# Patient Record
Sex: Female | Born: 1995 | Race: Black or African American | Hispanic: No | Marital: Single | Smoking: Never smoker
Health system: Southern US, Community
[De-identification: ages and names within clinical notes are randomized; demographics above are authoritative.]

## PROBLEM LIST (undated history)

## (undated) ENCOUNTER — Inpatient Hospital Stay (HOSPITAL_COMMUNITY): Payer: Self-pay

## (undated) DIAGNOSIS — Z789 Other specified health status: Secondary | ICD-10-CM

## (undated) HISTORY — PX: INDUCED ABORTION: SHX677

## (undated) HISTORY — PX: NO PAST SURGERIES: SHX2092

---

## 2018-09-03 ENCOUNTER — Encounter (HOSPITAL_COMMUNITY): Payer: Self-pay | Admitting: *Deleted

## 2018-09-03 ENCOUNTER — Inpatient Hospital Stay (HOSPITAL_COMMUNITY)
Admission: AD | Admit: 2018-09-03 | Discharge: 2018-09-03 | Disposition: A | Discharge status: Home / Self Care | Payer: 59 | Source: Ambulatory Visit | Attending: Obstetrics and Gynecology | Admitting: Obstetrics and Gynecology

## 2018-09-03 DIAGNOSIS — O219 Vomiting of pregnancy, unspecified: Secondary | ICD-10-CM | POA: Diagnosis not present

## 2018-09-03 DIAGNOSIS — R112 Nausea with vomiting, unspecified: Secondary | ICD-10-CM

## 2018-09-03 DIAGNOSIS — Z3A12 12 weeks gestation of pregnancy: Secondary | ICD-10-CM | POA: Insufficient documentation

## 2018-09-03 DIAGNOSIS — I951 Orthostatic hypotension: Secondary | ICD-10-CM

## 2018-09-03 DIAGNOSIS — R42 Dizziness and giddiness: Secondary | ICD-10-CM | POA: Diagnosis not present

## 2018-09-03 DIAGNOSIS — O26891 Other specified pregnancy related conditions, first trimester: Secondary | ICD-10-CM | POA: Diagnosis not present

## 2018-09-03 LAB — POCT PREGNANCY, URINE: Preg Test, Ur: POSITIVE — AB

## 2018-09-03 LAB — URINALYSIS, ROUTINE W REFLEX MICROSCOPIC
Bilirubin Urine: NEGATIVE
GLUCOSE, UA: NEGATIVE mg/dL
Hgb urine dipstick: NEGATIVE
KETONES UR: 20 mg/dL — AB
Nitrite: NEGATIVE
PH: 6 (ref 5.0–8.0)
PROTEIN: 30 mg/dL — AB
Specific Gravity, Urine: 1.03 (ref 1.005–1.030)

## 2018-09-03 MED ORDER — M.V.I. ADULT IV INJ
Freq: Once | INTRAVENOUS | Status: AC
  Administered 2018-09-03: 07:00:00 via INTRAVENOUS
  Filled 2018-09-03: qty 10

## 2018-09-03 MED ORDER — PROMETHAZINE HCL 25 MG PO TABS
25.0000 mg | ORAL_TABLET | Freq: Once | ORAL | Status: AC
  Administered 2018-09-03: 25 mg via ORAL
  Filled 2018-09-03: qty 1

## 2018-09-03 MED ORDER — LACTATED RINGERS IV SOLN
INTRAVENOUS | Status: DC
  Administered 2018-09-03: 06:00:00 via INTRAVENOUS

## 2018-09-03 MED ORDER — M.V.I. ADULT IV INJ: Freq: Once | INTRAVENOUS | Status: DC

## 2018-09-03 MED ORDER — PROMETHAZINE HCL 25 MG PO TABS: 12.5000 mg | ORAL_TABLET | Freq: Four times a day (QID) | ORAL | 0 refills | Status: DC | PRN

## 2018-09-03 NOTE — Discharge Instructions (Signed)
Morning Sickness °Morning sickness is when you feel sick to your stomach (nauseous) during pregnancy. This nauseous feeling may or may not come with vomiting. It often occurs in the morning but can be a problem any time of day. Morning sickness is most common during the first trimester, but it may continue throughout pregnancy. While morning sickness is unpleasant, it is usually harmless unless you develop severe and continual vomiting (hyperemesis gravidarum). This condition requires more intense treatment. °What are the causes? °The cause of morning sickness is not completely known but seems to be related to normal hormonal changes that occur in pregnancy. °What increases the risk? °You are at greater risk if you: °· Experienced nausea or vomiting before your pregnancy. °· Had morning sickness during a previous pregnancy. °· Are pregnant with more than one baby, such as twins. ° °How is this treated? °Do not use any medicines (prescription, over-the-counter, or herbal) for morning sickness without first talking to your health care provider. Your health care provider may prescribe or recommend: °· Vitamin B6 supplements. °· Anti-nausea medicines. °· The herbal medicine ginger. ° °Follow these instructions at home: °· Only take over-the-counter or prescription medicines as directed by your health care provider. °· Taking multivitamins before getting pregnant can prevent or decrease the severity of morning sickness in most women. °· Eat a piece of dry toast or unsalted crackers before getting out of bed in the morning. °· Eat five or six small meals a day. °· Eat dry and bland foods (rice, baked potato). Foods high in carbohydrates are often helpful. °· Do not drink liquids with your meals. Drink liquids between meals. °· Avoid greasy, fatty, and spicy foods. °· Get someone to cook for you if the smell of any food causes nausea and vomiting. °· If you feel nauseous after taking prenatal vitamins, take the vitamins at  night or with a snack. °· Snack on protein foods (nuts, yogurt, cheese) between meals if you are hungry. °· Eat unsweetened gelatins for desserts. °· Wearing an acupressure wristband (worn for sea sickness) may be helpful. °· Acupuncture may be helpful. °· Do not smoke. °· Get a humidifier to keep the air in your house free of odors. °· Get plenty of fresh air. °Contact a health care provider if: °· Your home remedies are not working, and you need medicine. °· You feel dizzy or lightheaded. °· You are losing weight. °Get help right away if: °· You have persistent and uncontrolled nausea and vomiting. °· You pass out (faint). °This information is not intended to replace advice given to you by your health care provider. Make sure you discuss any questions you have with your health care provider. °Document Released: 01/29/2007 Document Revised: 05/15/2016 Document Reviewed: 05/25/2013 °Elsevier Interactive Patient Education © 2017 [employer] ° °

## 2018-09-03 NOTE — MAU Note (Signed)
PT SAYS HAD PREG CONFIRMED AT OFFICE.  HAS BEEN VOMITING-  AT 0320.  . - VOMITED.   THEN FELT  WEAK AND DIZZY.  HAS AN APPOINTMENT THIS AM  WITH OFFICE.

## 2018-09-03 NOTE — MAU Provider Note (Signed)
Chief Complaint:  Emesis   First Provider Initiated Contact with Patient 09/03/18 0450     HPI: Haley Larsen is a 22 y.o. G2P0010 at 6312w1dwho presents to maternity admissions reporting vomiting once at home. Felt dizzy afterward so came in.  Vomited once here, small amount..Had morning sickness earlier in pregnancy that got better with no meds She reports good fetal movement, denies LOF, vaginal bleeding, vaginal itching/burning, urinary symptoms, h/a, dizziness, diarrhea, constipation or fever/chills.   Emesis   This is a recurrent problem. The current episode started today. The problem occurs less than 2 times per day. There has been no fever. Associated symptoms include dizziness. Pertinent negatives include no abdominal pain, chills, diarrhea or fever. She has tried nothing for the symptoms.     Past Medical History: No past medical history on file.  Past obstetric history: OB History  Gravida Para Term Preterm AB Living  2       1    SAB TAB Ectopic Multiple Live Births    1     0    # Outcome Date GA Lbr Len/2nd Weight Sex Delivery Anes PTL Lv  2 Current           1 TAB             Past Surgical History: History reviewed. No pertinent surgical history.  Family History: History reviewed. No pertinent family history.  Social History: Social History   Tobacco Use  . Smoking status: Never Smoker  . Smokeless tobacco: Never Used  Substance Use Topics  . Alcohol use: Not Currently    Comment: BEFORE PREG  . Drug use: Not Currently    Types: Marijuana    Comment: LAST SMOKED - AUG    Allergies: No Known Allergies  Meds:  No medications prior to admission.    I have reviewed patient's Past Medical Hx, Surgical Hx, Family Hx, Social Hx, medications and allergies.   ROS:  Review of Systems  Constitutional: Negative for chills and fever.  Gastrointestinal: Positive for vomiting. Negative for abdominal pain and diarrhea.  Neurological: Positive for dizziness.    Other systems negative  Physical Exam   Patient Vitals for the past 24 hrs:  BP Temp Temp src Pulse Resp Height Weight  09/03/18 0421 125/70 98.8 F (37.1 C) Oral 85 20 5\' 2"  (1.575 m) 61.9 kg   Vitals:   09/03/18 0421 09/03/18 0459 09/03/18 0500 09/03/18 0502  BP: 125/70 115/69 115/74 110/78  Pulse: 85 77 (!) 102 (!) 113  Resp: 20     Temp: 98.8 F (37.1 C)     TempSrc: Oral     Weight: 61.9 kg     Height: 5\' 2"  (1.575 m)       Constitutional: Well-developed, well-nourished female in no acute distress.  Cardiovascular: normal rate and rhythm Respiratory: normal effort, clear to auscultation bilaterally GI: Abd soft, non-tender, gravid appropriate for gestational age.   No rebound or guarding. MS: Extremities nontender, no edema, normal ROM Neurologic: Alert and oriented x 4.  GU: Neg CVAT.  PELVIC EXAM: deferred  FHT normal in office today via US   Labs: Results for orders placed or performed during the hospital encounter of 09/03/18 (from the past 24 hour(s))  Urinalysis, Routine w reflex microscopic     Status: Abnormal   Collection Time: 09/03/18  4:28 AM  Result Value Ref Range   Color, Urine YELLOW YELLOW   APPearance HAZY (A) CLEAR   Specific Gravity, Urine  1.030 1.005 - 1.030   pH 6.0 5.0 - 8.0   Glucose, UA NEGATIVE NEGATIVE mg/dL   Hgb urine dipstick NEGATIVE NEGATIVE   Bilirubin Urine NEGATIVE NEGATIVE   Ketones, ur 20 (A) NEGATIVE mg/dL   Protein, ur 30 (A) NEGATIVE mg/dL   Nitrite NEGATIVE NEGATIVE   Leukocytes, UA SMALL (A) NEGATIVE   RBC / HPF 0-5 0 - 5 RBC/hpf   WBC, UA 0-5 0 - 5 WBC/hpf   Bacteria, UA RARE (A) NONE SEEN   Squamous Epithelial / LPF 0-5 0 - 5   Mucus PRESENT   Pregnancy, urine POC     Status: Abnormal   Collection Time: 09/03/18  4:30 AM  Result Value Ref Range   Preg Test, Ur POSITIVE (A) NEGATIVE      Imaging:  No results found.  MAU Course/MDM: I have ordered labs and reviewed results.  UA does not show  significant dehydration.   Will check orthostatic vital signs  Consult Dr Hinton Rao with presentation, exam findings and test results.  Treatments in MAU included Phenergan, and two liters of IV fluids.   Has not vomited since second liter  Assessment: Single intrauterine pregnancy at [redacted]w[redacted]d Vomiting, new recurrence Mild orthostasis  Plan: Discharge home Advance diet as tolerated Will Rx Phenergan Follow up in Office for prenatal visits and recheck  Encouraged to return here or to other Urgent Care/ED if she develops worsening of symptoms, increase in pain, fever, or other concerning symptoms.   Pt stable at time of discharge.  Wynelle Bourgeois CNM, MSN Certified Nurse-Midwife 09/03/2018 4:54 AM

## 2018-09-06 LAB — OB RESULTS CONSOLE ANTIBODY SCREEN: ANTIBODY SCREEN: NEGATIVE

## 2018-09-06 LAB — OB RESULTS CONSOLE GC/CHLAMYDIA
CHLAMYDIA, DNA PROBE: NEGATIVE
GC PROBE AMP, GENITAL: NEGATIVE

## 2018-09-06 LAB — OB RESULTS CONSOLE ABO/RH: RH Type: POSITIVE

## 2018-09-06 LAB — OB RESULTS CONSOLE RPR: RPR: NONREACTIVE

## 2018-09-06 LAB — OB RESULTS CONSOLE HIV ANTIBODY (ROUTINE TESTING): HIV: NONREACTIVE

## 2018-09-06 LAB — OB RESULTS CONSOLE HEPATITIS B SURFACE ANTIGEN: HEP B S AG: NEGATIVE

## 2018-09-06 LAB — OB RESULTS CONSOLE RUBELLA ANTIBODY, IGM: Rubella: NON-IMMUNE/NOT IMMUNE

## 2018-12-22 NOTE — L&D Delivery Note (Signed)
Delivery Note Pt pushed very well for 30-73min.  At 12:19 AM a viable and healthy female was delivered via Vaginal, Spontaneous (Presentation: OA; LOT - compound presentation with hand ).  APGAR: 9,9 ; weight P .   Placenta status: delivered, intact.  Cord: 3V  with the following complications: none.   Anesthesia:  epidural Episiotomy: None Lacerations: 1st degree;Vaginal; R labial - hemostatic Suture Repair: 3.0 vicryl rapide Est. Blood Loss (mL):  193cc  Mom to postpartum.  Baby to Couplet care / Skin to Skin.  Haley Larsen 03/18/2019, 12:36 AM  A+/RNI/Br/Contra ?/no Tdap in [facility]

## 2019-01-06 ENCOUNTER — Encounter (HOSPITAL_COMMUNITY): Payer: Self-pay

## 2019-01-06 ENCOUNTER — Inpatient Hospital Stay (HOSPITAL_BASED_OUTPATIENT_CLINIC_OR_DEPARTMENT_OTHER): Payer: 59

## 2019-01-06 ENCOUNTER — Inpatient Hospital Stay (HOSPITAL_COMMUNITY)
Admission: AD | Admit: 2019-01-06 | Discharge: 2019-01-07 | Disposition: A | Discharge status: Home / Self Care | Payer: 59 | Attending: Obstetrics and Gynecology | Admitting: Obstetrics and Gynecology

## 2019-01-06 DIAGNOSIS — O4693 Antepartum hemorrhage, unspecified, third trimester: Secondary | ICD-10-CM

## 2019-01-06 DIAGNOSIS — Z3A30 30 weeks gestation of pregnancy: Secondary | ICD-10-CM | POA: Insufficient documentation

## 2019-01-06 DIAGNOSIS — O368130 Decreased fetal movements, third trimester, not applicable or unspecified: Secondary | ICD-10-CM | POA: Insufficient documentation

## 2019-01-06 DIAGNOSIS — B373 Candidiasis of vulva and vagina: Secondary | ICD-10-CM | POA: Insufficient documentation

## 2019-01-06 DIAGNOSIS — O98813 Other maternal infectious and parasitic diseases complicating pregnancy, third trimester: Secondary | ICD-10-CM | POA: Insufficient documentation

## 2019-01-06 DIAGNOSIS — N859 Noninflammatory disorder of uterus, unspecified: Secondary | ICD-10-CM

## 2019-01-06 DIAGNOSIS — N858 Other specified noninflammatory disorders of uterus: Secondary | ICD-10-CM

## 2019-01-06 LAB — URINALYSIS, ROUTINE W REFLEX MICROSCOPIC
BILIRUBIN URINE: NEGATIVE
GLUCOSE, UA: NEGATIVE mg/dL
HGB URINE DIPSTICK: NEGATIVE
KETONES UR: NEGATIVE mg/dL
Nitrite: NEGATIVE
Protein, ur: NEGATIVE mg/dL
Specific Gravity, Urine: 1.016 (ref 1.005–1.030)
pH: 6 (ref 5.0–8.0)

## 2019-01-06 NOTE — MAU Note (Addendum)
Pt here for light bleeding after using the bathroom earlier today. States she used the bathroom prior to coming in and it was less than earlier. Pt reports decreased fetal movement. Reports last feeling baby move around 6pm. Pt reports vaginal discomfort but denies contractions. FHR 150's in triage with audible fetal movement noted.

## 2019-01-06 NOTE — MAU Provider Note (Signed)
Chief Complaint:  Decreased Fetal Movement and Vaginal Bleeding   First Provider Initiated Contact with Patient 01/06/19 2203     HPI: Lance CoonJa'Lynn Cammarata is a 23 y.o. G2P0010 at 7130w0dwho presents to maternity admissions reporting pink/brown spotting today with decreased fetal movement this evening.  20 week US showed posterior placenta.. No recent intercourse.  She reports good fetal movement now, denies LOF, vaginal itching/burning, urinary symptoms, h/a, dizziness, n/v, diarrhea, constipation or fever/chills.    Vaginal Bleeding  The patient's primary symptoms include genital itching, vaginal bleeding (once today) and vaginal discharge. The patient's pertinent negatives include no genital lesions, genital odor or pelvic pain. This is a new problem. The current episode started today. The problem occurs rarely. The problem has been resolved. The patient is experiencing no pain. She is pregnant. Pertinent negatives include no abdominal pain, back pain, chills, constipation, diarrhea, dysuria, fever, frequency, nausea or vomiting. The vaginal discharge was white, thick and copious. There has been no bleeding. She has not been passing clots. She has not been passing tissue. Nothing aggravates the symptoms. She has tried nothing for the symptoms.   RN note: Pt here for light bleeding after using the bathroom earlier today. States she used the bathroom prior to coming in and it was less than earlier. Pt reports decreased fetal movement. Reports last feeling baby move around 6pm. Pt reports vaginal discomfort but denies contractions. FHR 150's in triage with audible fetal movement noted.  Past Medical History: History reviewed. No pertinent past medical history.  Past obstetric history: OB History  Gravida Para Term Preterm AB Living  2       1    SAB TAB Ectopic Multiple Live Births    1     0    # Outcome Date GA Lbr Len/2nd Weight Sex Delivery Anes PTL Lv  2 Current           1 TAB              Past Surgical History: History reviewed. No pertinent surgical history.  Family History: No family history on file.  Social History: Social History   Tobacco Use  . Smoking status: Never Smoker  . Smokeless tobacco: Never Used  Substance Use Topics  . Alcohol use: Not Currently    Comment: BEFORE PREG  . Drug use: Not Currently    Types: Marijuana    Comment: LAST SMOKED - AUG    Allergies: No Known Allergies  Meds:  Medications Prior to Admission  Medication Sig Dispense Refill Last Dose  . promethazine (PHENERGAN) 25 MG tablet Take 0.5-1 tablets (12.5-25 mg total) by mouth every 6 (six) hours as needed. 30 tablet 0     I have reviewed patient's Past Medical Hx, Surgical Hx, Family Hx, Social Hx, medications and allergies.   ROS:  Review of Systems  Constitutional: Negative for chills and fever.  Gastrointestinal: Negative for abdominal pain, constipation, diarrhea, nausea and vomiting.  Genitourinary: Positive for vaginal bleeding and vaginal discharge. Negative for dysuria, frequency and pelvic pain.  Musculoskeletal: Negative for back pain.   Other systems negative  Physical Exam   Patient Vitals for the past 24 hrs:  BP Temp Temp src Pulse Resp SpO2 Height Weight  01/06/19 2113 122/72 98 F (36.7 C) Oral (!) 106 16 97 % 5\' 3"  (1.6 m) 78 kg   Constitutional: Well-developed, well-nourished female in no acute distress.  Cardiovascular: normal rate and rhythm Respiratory: normal effort, clear to auscultation bilaterally GI: Abd  soft, non-tender, gravid appropriate for gestational age.   No rebound or guarding. MS: Extremities nontender, no edema, normal ROM Neurologic: Alert and oriented x 4.  GU: Neg CVAT.  PELVIC EXAM: Cervix pink, visually closed, without lesion, copious cottage-cheese looking discharge, vaginal walls and external genitalia normal     No colored/pink/brown discharge seen at all.  Cervix without lesion.  Dilation: Closed Effacement  (%): 20 Exam by:: Artelia LarocheM Brogen Duell CNM   FHT:  Baseline 140 , moderate variability, accelerations present, no decelerations except one tiny variable lasting 10 seconds.   Contractions: q 3-4 mins Irregular, not felt by patient    Labs: Results for orders placed or performed during the hospital encounter of 01/06/19 (from the past 24 hour(s))  Urinalysis, Routine w reflex microscopic     Status: Abnormal   Collection Time: 01/06/19  9:35 PM  Result Value Ref Range   Color, Urine YELLOW YELLOW   APPearance CLEAR CLEAR   Specific Gravity, Urine 1.016 1.005 - 1.030   pH 6.0 5.0 - 8.0   Glucose, UA NEGATIVE NEGATIVE mg/dL   Hgb urine dipstick NEGATIVE NEGATIVE   Bilirubin Urine NEGATIVE NEGATIVE   Ketones, ur NEGATIVE NEGATIVE mg/dL   Protein, ur NEGATIVE NEGATIVE mg/dL   Nitrite NEGATIVE NEGATIVE   Leukocytes, UA LARGE (A) NEGATIVE   RBC / HPF 0-5 0 - 5 RBC/hpf   WBC, UA 6-10 0 - 5 WBC/hpf   Bacteria, UA RARE (A) NONE SEEN   Squamous Epithelial / LPF 0-5 0 - 5   Mucus PRESENT       Imaging:  US showed posterior placenta No evidence of previa or abruption AFI normal   MAU Course/MDM: I have ordered labs and reviewed results.  NST reviewed, reactive.  Uterine contractions diminished over time.  Consult Dr Erin FullingHarraway-Smith with presentation, exam findings and test results.  Treatments in MAU included EFM, PO hydration.    Reviewed findings Light pink bleeding was likely related to erethema from vaginal yeast. Contractions were painless and did not change cervix Reviewed signs of preterm labor EFM has been reactive throughout  Assessment: Single intrauterine pregnancy at 7679w1d Decreased fetal movement Reported pink spotting, none seen on exam Vaginal yeast infection Uterine irritabililty   Plan: Discharge home Labor precautions and fetal kick counts Follow up in Office for prenatal visits and recheck of status Has appt on Tuesday  Encouraged to return here or to other  Urgent Care/ED if she develops worsening of symptoms, increase in pain, fever, or other concerning symptoms.  Pt stable at time of discharge.  Wynelle BourgeoisMarie Glen Blatchley CNM, MSN Certified Nurse-Midwife 01/06/2019 10:03 PM

## 2019-01-07 ENCOUNTER — Other Ambulatory Visit: Payer: Self-pay | Admitting: Advanced Practice Midwife

## 2019-01-07 DIAGNOSIS — O368130 Decreased fetal movements, third trimester, not applicable or unspecified: Secondary | ICD-10-CM

## 2019-01-07 MED ORDER — TERCONAZOLE 0.4 % VA CREA: 1.0000 | TOPICAL_CREAM | Freq: Every day | VAGINAL | 0 refills | Status: DC

## 2019-01-07 NOTE — Discharge Instructions (Signed)
Vaginal Bleeding During Pregnancy, Third Trimester  A small amount of bleeding (spotting) from the vagina is common during pregnancy. Sometimes the bleeding is normal and is not a problem, and sometimes it is a sign of something serious. Tell your doctor about any bleeding from your vagina right away. Follow these instructions at home: Activity  Follow your doctor's instructions about how active you can be. Your doctor may recommend that you: ? Stay in bed and only get up to use the bathroom. ? Continue light activity.  If needed, make plans for someone to help you with your normal activities.  Ask your doctor if it is safe for you to drive.  Do not lift anything that is heavier than 10 lb (4.5 kg) until your doctor says that this is safe.  Do not have sex or orgasms until your doctor says that this is safe. Medicines  Take over-the-counter and prescription medicines only as told by your doctor.  Do not take aspirin. It can cause bleeding. General instructions  Watch your condition for any changes.  Write down: ? The number of pads you use each day. ? How often you change pads. ? How soaked (saturated) your pads are.  Do not use tampons.  Do not douche.  If you pass any tissue from your vagina, save the tissue to show your doctor.  Keep all follow-up visits as told by your doctor. This is important. Contact a doctor if:  You have vaginal bleeding at any time during pregnancy.  You have cramps.  You have a fever. Get help right away if:  You have very bad cramps.  You have very bad pain in your back or belly (abdomen).  You have a gush of fluid from your vagina.  You pass large clots or a lot of tissue from your vagina.  Your bleeding gets worse.  You feel light-headed or weak.  You pass out (faint).  Your baby is moving less than usual, or not moving at all. Summary  Tell your doctor about any bleeding from your vagina right away.  Follow instructions  from your doctor about how active you can be. You may need someone to help you with your normal activities. This information is not intended to replace advice given to you by your health care provider. Make sure you discuss any questions you have with your health care provider. Document Released: 04/24/2014 Document Revised: 03/11/2017 Document Reviewed: 03/11/2017 Elsevier Interactive Patient Education  2019 [employer] Inc[employer]   Fetal Movement Counts Patient Name: ________________________________________________ Patient Due Date: ____________________ What is a fetal movement count?  A fetal movement count is the number of times that you feel your baby move during a certain amount of time. This may also be called a fetal kick count. A fetal movement count is recommended for every pregnant woman. You may be asked to start counting fetal movements as early as week 28 of your pregnancy. Pay attention to when your baby is most active. You may notice your baby's sleep and wake cycles. You may also notice things that make your baby move more. You should do a fetal movement count:  When your baby is normally most active.  At the same time each day. A good time to count movements is while you are resting, after having something to eat and drink. How do I count fetal movements? 1. Find a quiet, comfortable area. Sit, or lie down on your side. 2. Write down the date, the start time and stop  time, and the number of movements that you felt between those two times. Take this information with you to your health care visits. 3. For 2 hours, count kicks, flutters, swishes, rolls, and jabs. You should feel at least 10 movements during 2 hours. 4. You may stop counting after you have felt 10 movements. 5. If you do not feel 10 movements in 2 hours, have something to eat and drink. Then, keep resting and counting for 1 hour. If you feel at least 4 movements during that hour, you may stop counting. Contact a health  care provider if:  You feel fewer than 4 movements in 2 hours.  Your baby is not moving like he or she usually does. Date: ____________ Start time: ____________ Stop time: ____________ Movements: ____________ Date: ____________ Start time: ____________ Stop time: ____________ Movements: ____________ Date: ____________ Start time: ____________ Stop time: ____________ Movements: ____________ Date: ____________ Start time: ____________ Stop time: ____________ Movements: ____________ Date: ____________ Start time: ____________ Stop time: ____________ Movements: ____________ Date: ____________ Start time: ____________ Stop time: ____________ Movements: ____________ Date: ____________ Start time: ____________ Stop time: ____________ Movements: ____________ Date: ____________ Start time: ____________ Stop time: ____________ Movements: ____________ Date: ____________ Start time: ____________ Stop time: ____________ Movements: ____________ This information is not intended to replace advice given to you by your health care provider. Make sure you discuss any questions you have with your health care provider. Document Released: 01/07/2007 Document Revised: 08/06/2016 Document Reviewed: 01/17/2016 Elsevier Interactive Patient Education  2019 [employer].  Third Trimester of Pregnancy  The third trimester is from week 28 through week 40 (months 7 through 9). This trimester is when your unborn baby (fetus) is growing very fast. At the end of the ninth month, the unborn baby is about 20 inches in length. It weighs about 6-10 pounds. Follow these instructions at home: Medicines  Take over-the-counter and prescription medicines only as told by your doctor. Some medicines are safe and some medicines are not safe during pregnancy.  Take a prenatal vitamin that contains at least 600 micrograms (mcg) of folic acid.  If you have trouble pooping (constipation), take medicine that will make your stool soft  (stool softener) if your doctor approves. Eating and drinking   Eat regular, healthy meals.  Avoid raw meat and uncooked cheese.  If you get low calcium from the food you eat, talk to your doctor about taking a daily calcium supplement.  Eat four or five small meals rather than three large meals a day.  Avoid foods that are high in fat and sugars, such as fried and sweet foods.  To prevent constipation: ? Eat foods that are high in fiber, like fresh fruits and vegetables, whole grains, and beans. ? Drink enough fluids to keep your pee (urine) clear or pale yellow. Activity  Exercise only as told by your doctor. Stop exercising if you start to have cramps.  Avoid heavy lifting, wear low heels, and sit up straight.  Do not exercise if it is too hot, too humid, or if you are in a place of great height (high altitude).  You may continue to have sex unless your doctor tells you not to. Relieving pain and discomfort  Wear a good support bra if your breasts are tender.  Take frequent breaks and rest with your legs raised if you have leg cramps or low back pain.  Take warm water baths (sitz baths) to soothe pain or discomfort caused by hemorrhoids. Use hemorrhoid cream if your doctor  approves.  If you develop puffy, bulging veins (varicose veins) in your legs: ? Wear support hose or compression stockings as told by your doctor. ? Raise (elevate) your feet for 15 minutes, 3-4 times a day. ? Limit salt in your food. Safety  Wear your seat belt when driving.  Make a list of emergency phone numbers, including numbers for family, friends, the hospital, and police and fire departments. Preparing for your baby's arrival To prepare for the arrival of your baby:  Take prenatal classes.  Practice driving to the hospital.  Visit the hospital and tour the maternity area.  Talk to your work about taking leave once the baby comes.  Pack your hospital bag.  Prepare the baby's  room.  Go to your doctor visits.  Buy a rear-facing car seat. Learn how to install it in your car. General instructions  Do not use hot tubs, steam rooms, or saunas.  Do not use any products that contain nicotine or tobacco, such as cigarettes and e-cigarettes. If you need help quitting, ask your doctor.  Do not drink alcohol.  Do not douche or use tampons or scented sanitary pads.  Do not cross your legs for long periods of time.  Do not travel for long distances unless you must. Only do so if your doctor says it is okay.  Visit your dentist if you have not gone during your pregnancy. Use a soft toothbrush to brush your teeth. Be gentle when you floss.  Avoid cat litter boxes and soil used by cats. These carry germs that can cause birth defects in the baby and can cause a loss of your baby (miscarriage) or stillbirth.  Keep all your prenatal visits as told by your doctor. This is important. Contact a doctor if:  You are not sure if you are in labor or if your water has broken.  You are dizzy.  You have mild cramps or pressure in your lower belly.  You have a nagging pain in your belly area.  You continue to feel sick to your stomach, you throw up, or you have watery poop.  You have bad smelling fluid coming from your vagina.  You have pain when you pee. Get help right away if:  You have a fever.  You are leaking fluid from your vagina.  You are spotting or bleeding from your vagina.  You have severe belly cramps or pain.  You lose or gain weight quickly.  You have trouble catching your breath and have chest pain.  You notice sudden or extreme puffiness (swelling) of your face, hands, ankles, feet, or legs.  You have not felt the baby move in over an hour.  You have severe headaches that do not go away with medicine.  You have trouble seeing.  You are leaking, or you are having a gush of fluid, from your vagina before you are 37 weeks.  You have regular  belly spasms (contractions) before you are 37 weeks. Summary  The third trimester is from week 28 through week 40 (months 7 through 9). This time is when your unborn baby is growing very fast.  Follow your doctor's advice about medicine, food, and activity.  Get ready for the arrival of your baby by taking prenatal classes, getting all the baby items ready, preparing the baby's room, and visiting your doctor to be checked.  Get help right away if you are bleeding from your vagina, or you have chest pain and trouble catching your breath, or  if you have not felt your baby move in over an hour. This information is not intended to replace advice given to you by your health care provider. Make sure you discuss any questions you have with your health care provider. Document Released: 03/04/2010 Document Revised: 01/13/2017 Document Reviewed: 01/13/2017 Elsevier Interactive Patient Education  2019 [employer]

## 2019-01-07 NOTE — Progress Notes (Unsigned)
Vaginal yeast Rx Terazol 7

## 2019-01-07 NOTE — MAU Note (Signed)
Reported to bedside to go over discharge paperwork with patient and she was not in room.  Reported to CNM.  CNM states she spoke with patient prior to leaving about discharge teachings and patient was aware of reasons to return.

## 2019-02-23 LAB — OB RESULTS CONSOLE GBS: GBS: NEGATIVE

## 2019-03-04 ENCOUNTER — Encounter (HOSPITAL_COMMUNITY): Payer: Self-pay | Admitting: *Deleted

## 2019-03-04 ENCOUNTER — Inpatient Hospital Stay (HOSPITAL_COMMUNITY)
Admission: AD | Admit: 2019-03-04 | Discharge: 2019-03-04 | Disposition: A | Discharge status: Home / Self Care | Payer: 59 | Attending: Obstetrics and Gynecology | Admitting: Obstetrics and Gynecology

## 2019-03-04 ENCOUNTER — Other Ambulatory Visit: Payer: Self-pay

## 2019-03-04 DIAGNOSIS — Z3A38 38 weeks gestation of pregnancy: Secondary | ICD-10-CM | POA: Diagnosis not present

## 2019-03-04 DIAGNOSIS — O479 False labor, unspecified: Secondary | ICD-10-CM

## 2019-03-04 DIAGNOSIS — O471 False labor at or after 37 completed weeks of gestation: Secondary | ICD-10-CM | POA: Insufficient documentation

## 2019-03-04 DIAGNOSIS — Z3A30 30 weeks gestation of pregnancy: Secondary | ICD-10-CM

## 2019-03-04 HISTORY — DX: Other specified health status: Z78.9

## 2019-03-04 NOTE — Progress Notes (Signed)
NST Fetal Monitoring: Baseline: 135 Variability: mod Accelerations: + Decelerations: no Contractions: irregular    Donette Larry, CNM  03/04/2019 11:59 AM

## 2019-03-04 NOTE — MAU Note (Signed)
Presents with c/o ctxs every 6 minutes apart, reports ctxs started @ 0940 this am.  Denies VB or LOF.  Reports +FM.

## 2019-03-04 NOTE — Discharge Instructions (Signed)
Braxton Hicks Contractions Contractions of the uterus can occur throughout pregnancy, but they are not always a sign that you are in labor. You may have practice contractions called Braxton Hicks contractions. These false labor contractions are sometimes confused with true labor. What are Braxton Hicks contractions? Braxton Hicks contractions are tightening movements that occur in the muscles of the uterus before labor. Unlike true labor contractions, these contractions do not result in opening (dilation) and thinning of the cervix. Toward the end of pregnancy (32-34 weeks), Braxton Hicks contractions can happen more often and may become stronger. These contractions are sometimes difficult to tell apart from true labor because they can be very uncomfortable. You should not feel embarrassed if you go to the hospital with false labor. Sometimes, the only way to tell if you are in true labor is for your health care provider to look for changes in the cervix. The health care provider will do a physical exam and may monitor your contractions. If you are not in true labor, the exam should show that your cervix is not dilating and your water has not broken. If there are no other health problems associated with your pregnancy, it is completely safe for you to be sent home with false labor. You may continue to have Braxton Hicks contractions until you go into true labor. How to tell the difference between true labor and false labor True labor  Contractions last 30-70 seconds.  Contractions become very regular.  Discomfort is usually felt in the top of the uterus, and it spreads to the lower abdomen and low back.  Contractions do not go away with walking.  Contractions usually become more intense and increase in frequency.  The cervix dilates and gets thinner. False labor  Contractions are usually shorter and not as strong as true labor contractions.  Contractions are usually irregular.  Contractions  are often felt in the front of the lower abdomen and in the groin.  Contractions may go away when you walk around or change positions while lying down.  Contractions get weaker and are shorter-lasting as time goes on.  The cervix usually does not dilate or become thin. Follow these instructions at home:   Take over-the-counter and prescription medicines only as told by your health care provider.  Keep up with your usual exercises and follow other instructions from your health care provider.  Eat and drink lightly if you think you are going into labor.  If Braxton Hicks contractions are making you uncomfortable: ? Change your position from lying down or resting to walking, or change from walking to resting. ? Sit and rest in a tub of warm water. ? Drink enough fluid to keep your urine pale yellow. Dehydration may cause these contractions. ? Do slow and deep breathing several times an hour.  Keep all follow-up prenatal visits as told by your health care provider. This is important. Contact a health care provider if:  You have a fever.  You have continuous pain in your abdomen. Get help right away if:  Your contractions become stronger, more regular, and closer together.  You have fluid leaking or gushing from your vagina.  You pass blood-tinged mucus (bloody show).  You have bleeding from your vagina.  You have low back pain that you never had before.  You feel your baby's head pushing down and causing pelvic pressure.  Your baby is not moving inside you as much as it used to. Summary  Contractions that occur before labor are   called Braxton Hicks contractions, false labor, or practice contractions.  Braxton Hicks contractions are usually shorter, weaker, farther apart, and less regular than true labor contractions. True labor contractions usually become progressively stronger and regular, and they become more frequent.  Manage discomfort from Braxton Hicks contractions  by changing position, resting in a warm bath, drinking plenty of water, or practicing deep breathing. This information is not intended to replace advice given to you by your health care provider. Make sure you discuss any questions you have with your health care provider. Document Released: 04/23/2017 Document Revised: 09/22/2017 Document Reviewed: 04/23/2017 Elsevier Interactive Patient Education  2019 Elsevier Inc.  

## 2019-03-17 ENCOUNTER — Encounter (HOSPITAL_COMMUNITY): Payer: Self-pay | Admitting: *Deleted

## 2019-03-17 ENCOUNTER — Inpatient Hospital Stay (HOSPITAL_COMMUNITY)
Admission: AD | Admit: 2019-03-17 | Discharge: 2019-03-19 | DRG: 807 | Disposition: A | Discharge status: Home / Self Care | Payer: 59 | Attending: Obstetrics and Gynecology | Admitting: Obstetrics and Gynecology

## 2019-03-17 ENCOUNTER — Other Ambulatory Visit: Payer: Self-pay

## 2019-03-17 ENCOUNTER — Inpatient Hospital Stay (HOSPITAL_COMMUNITY): Payer: 59 | Admitting: Anesthesiology

## 2019-03-17 ENCOUNTER — Telehealth (HOSPITAL_COMMUNITY): Payer: Self-pay | Admitting: *Deleted

## 2019-03-17 ENCOUNTER — Encounter (HOSPITAL_COMMUNITY): Payer: Self-pay

## 2019-03-17 DIAGNOSIS — O326XX0 Maternal care for compound presentation, not applicable or unspecified: Principal | ICD-10-CM | POA: Diagnosis present

## 2019-03-17 DIAGNOSIS — Z3A40 40 weeks gestation of pregnancy: Secondary | ICD-10-CM | POA: Diagnosis not present

## 2019-03-17 DIAGNOSIS — O26893 Other specified pregnancy related conditions, third trimester: Secondary | ICD-10-CM | POA: Diagnosis present

## 2019-03-17 LAB — TYPE AND SCREEN
ABO/RH(D): A POS
Antibody Screen: NEGATIVE

## 2019-03-17 LAB — CBC
HCT: 33 % — ABNORMAL LOW (ref 36.0–46.0)
Hemoglobin: 9.9 g/dL — ABNORMAL LOW (ref 12.0–15.0)
MCH: 23.8 pg — ABNORMAL LOW (ref 26.0–34.0)
MCHC: 30 g/dL (ref 30.0–36.0)
MCV: 79.3 fL — ABNORMAL LOW (ref 80.0–100.0)
NRBC: 0 % (ref 0.0–0.2)
PLATELETS: 331 10*3/uL (ref 150–400)
RBC: 4.16 MIL/uL (ref 3.87–5.11)
RDW: 13.9 % (ref 11.5–15.5)
WBC: 9.4 10*3/uL (ref 4.0–10.5)

## 2019-03-17 LAB — ABO/RH: ABO/RH(D): A POS

## 2019-03-17 MED ORDER — LACTATED RINGERS IV SOLN: 500.0000 mL | Freq: Once | INTRAVENOUS | Status: DC

## 2019-03-17 MED ORDER — OXYCODONE-ACETAMINOPHEN 5-325 MG PO TABS: 1.0000 | ORAL_TABLET | ORAL | Status: DC | PRN

## 2019-03-17 MED ORDER — PHENYLEPHRINE 40 MCG/ML (10ML) SYRINGE FOR IV PUSH (FOR BLOOD PRESSURE SUPPORT): 80.0000 ug | PREFILLED_SYRINGE | INTRAVENOUS | Status: DC | PRN

## 2019-03-17 MED ORDER — OXYTOCIN BOLUS FROM INFUSION: 500.0000 mL | Freq: Once | INTRAVENOUS | Status: DC

## 2019-03-17 MED ORDER — LACTATED RINGERS IV SOLN
INTRAVENOUS | Status: DC
  Administered 2019-03-17 (×2): via INTRAVENOUS

## 2019-03-17 MED ORDER — DIPHENHYDRAMINE HCL 50 MG/ML IJ SOLN: 12.5000 mg | INTRAMUSCULAR | Status: DC | PRN

## 2019-03-17 MED ORDER — FENTANYL-BUPIVACAINE-NACL 0.5-0.125-0.9 MG/250ML-% EP SOLN
12.0000 mL/h | EPIDURAL | Status: DC | PRN
  Filled 2019-03-17: qty 250

## 2019-03-17 MED ORDER — FLEET ENEMA 7-19 GM/118ML RE ENEM: 1.0000 | ENEMA | RECTAL | Status: DC | PRN

## 2019-03-17 MED ORDER — BUTORPHANOL TARTRATE 1 MG/ML IJ SOLN
1.0000 mg | INTRAMUSCULAR | Status: DC | PRN
  Administered 2019-03-17: 1 mg via INTRAVENOUS
  Filled 2019-03-17: qty 1

## 2019-03-17 MED ORDER — PHENYLEPHRINE 40 MCG/ML (10ML) SYRINGE FOR IV PUSH (FOR BLOOD PRESSURE SUPPORT)
80.0000 ug | PREFILLED_SYRINGE | INTRAVENOUS | Status: DC | PRN
  Administered 2019-03-17: 80 ug via INTRAVENOUS
  Filled 2019-03-17: qty 10

## 2019-03-17 MED ORDER — EPHEDRINE 5 MG/ML INJ: 10.0000 mg | INTRAVENOUS | Status: DC | PRN

## 2019-03-17 MED ORDER — LACTATED RINGERS IV SOLN
500.0000 mL | INTRAVENOUS | Status: DC | PRN
  Administered 2019-03-17 (×2): 500 mL via INTRAVENOUS

## 2019-03-17 MED ORDER — SOD CITRATE-CITRIC ACID 500-334 MG/5ML PO SOLN: 30.0000 mL | ORAL | Status: DC | PRN

## 2019-03-17 MED ORDER — LIDOCAINE HCL (PF) 1 % IJ SOLN: 30.0000 mL | INTRAMUSCULAR | Status: DC | PRN

## 2019-03-17 MED ORDER — OXYTOCIN 40 UNITS IN NORMAL SALINE INFUSION - SIMPLE MED
1.0000 m[IU]/min | INTRAVENOUS | Status: DC
  Administered 2019-03-17: 2 m[IU]/min via INTRAVENOUS
  Filled 2019-03-17: qty 1000

## 2019-03-17 MED ORDER — OXYCODONE-ACETAMINOPHEN 5-325 MG PO TABS: 2.0000 | ORAL_TABLET | ORAL | Status: DC | PRN

## 2019-03-17 MED ORDER — SODIUM CHLORIDE (PF) 0.9 % IJ SOLN
INTRAMUSCULAR | Status: DC | PRN
  Administered 2019-03-17: 11 mL/h via EPIDURAL

## 2019-03-17 MED ORDER — ACETAMINOPHEN 325 MG PO TABS: 650.0000 mg | ORAL_TABLET | ORAL | Status: DC | PRN

## 2019-03-17 MED ORDER — OXYTOCIN 40 UNITS IN NORMAL SALINE INFUSION - SIMPLE MED: 2.5000 [IU]/h | INTRAVENOUS | Status: DC

## 2019-03-17 MED ORDER — PHENYLEPHRINE 40 MCG/ML (10ML) SYRINGE FOR IV PUSH (FOR BLOOD PRESSURE SUPPORT)
80.0000 ug | PREFILLED_SYRINGE | INTRAVENOUS | Status: AC | PRN
  Administered 2019-03-17 (×3): 80 ug via INTRAVENOUS

## 2019-03-17 MED ORDER — LIDOCAINE-EPINEPHRINE (PF) 2 %-1:200000 IJ SOLN
INTRAMUSCULAR | Status: DC | PRN
  Administered 2019-03-17 (×2): 5 mL via EPIDURAL

## 2019-03-17 MED ORDER — TERBUTALINE SULFATE 1 MG/ML IJ SOLN: 0.2500 mg | Freq: Once | INTRAMUSCULAR | Status: DC | PRN

## 2019-03-17 MED ORDER — ONDANSETRON HCL 4 MG/2ML IJ SOLN: 4.0000 mg | Freq: Four times a day (QID) | INTRAMUSCULAR | Status: DC | PRN

## 2019-03-17 MED ORDER — FENTANYL-BUPIVACAINE-NACL 0.5-0.125-0.9 MG/250ML-% EP SOLN: 12.0000 mL/h | EPIDURAL | Status: DC | PRN

## 2019-03-17 NOTE — Anesthesia Procedure Notes (Signed)
Epidural Patient location during procedure: OB Start time: 03/17/2019 10:14 PM End time: 03/17/2019 10:22 PM  Staffing Anesthesiologist: Shelton Silvas, MD Performed: anesthesiologist   Preanesthetic Checklist Completed: patient identified, site marked, surgical consent, pre-op evaluation, timeout performed, IV checked, risks and benefits discussed and monitors and equipment checked  Epidural Patient position: sitting Prep: ChloraPrep Patient monitoring: heart rate, continuous pulse ox and blood pressure Approach: midline Location: L3-L4 Injection technique: LOR saline  Needle:  Needle type: Tuohy  Needle gauge: 17 G Needle length: 9 cm Catheter type: closed end flexible Catheter size: 20 Guage Test dose: negative and 1.5% lidocaine  Assessment Events: blood not aspirated, injection not painful, no injection resistance and no paresthesia  Additional Notes LOR @ 6.5  Patient identified. Risks/Benefits/Options discussed with patient including but not limited to bleeding, infection, nerve damage, paralysis, failed block, incomplete pain control, headache, blood pressure changes, nausea, vomiting, reactions to medications, itching and postpartum back pain. Confirmed with bedside nurse the patient's most recent platelet count. Confirmed with patient that they are not currently taking any anticoagulation, have any bleeding history or any family history of bleeding disorders. Patient expressed understanding and wished to proceed. All questions were answered. Sterile technique was used throughout the entire procedure. Please see nursing notes for vital signs. Test dose was given through epidural catheter and negative prior to continuing to dose epidural or start infusion. Warning signs of high block given to the patient including shortness of breath, tingling/numbness in hands, complete motor block, or any concerning symptoms with instructions to call for help. Patient was given instructions  on fall risk and not to get out of bed. All questions and concerns addressed with instructions to call with any issues or inadequate analgesia.    Reason for block:procedure for pain

## 2019-03-17 NOTE — Anesthesia Preprocedure Evaluation (Signed)
Anesthesia Evaluation  Patient identified by MRN, date of birth, ID band Patient awake    Reviewed: Allergy & Precautions, Patient's Chart, lab work & pertinent test results  Airway Mallampati: I  TM Distance: >3 FB Neck ROM: Full    Dental  (+) Teeth Intact, Dental Advisory Given   Pulmonary    breath sounds clear to auscultation       Cardiovascular negative cardio ROS   Rhythm:Regular Rate:Normal     Neuro/Psych    GI/Hepatic negative GI ROS, Neg liver ROS,   Endo/Other  negative endocrine ROS  Renal/GU negative Renal ROS     Musculoskeletal   Abdominal   Peds  Hematology negative hematology ROS (+)   Anesthesia Other Findings   Reproductive/Obstetrics (+) Pregnancy                             Anesthesia Physical Anesthesia Plan  ASA: II  Anesthesia Plan: Epidural   Post-op Pain Management:    Induction:   PONV Risk Score and Plan:   Airway Management Planned:   Additional Equipment:   Intra-op Plan:   Post-operative Plan:   Informed Consent: I have reviewed the patients History and Physical, chart, labs and discussed the procedure including the risks, benefits and alternatives for the proposed anesthesia with the patient or authorized representative who has indicated his/her understanding and acceptance.       Plan Discussed with:   Anesthesia Plan Comments: (Lab Results      Component                Value               Date                      WBC                      9.4                 03/17/2019                HGB                      9.9 (L)             03/17/2019                HCT                      33.0 (L)            03/17/2019                MCV                      79.3 (L)            03/17/2019                PLT                      331                 03/17/2019           )        Anesthesia Quick Evaluation

## 2019-03-17 NOTE — Progress Notes (Signed)
Anesthesia called and notified of bp baseline, bp drop to mid 90s sbp, and of fetal strip showing decels. Ordered to give phenylephrine and another bolus until bp returns to baseline and fhr no longer symptomatic.

## 2019-03-17 NOTE — Progress Notes (Signed)
Patient ID: Haley Larsen, female   DOB: 01/09/1996, 23 y.o.   MRN: [id number]   Feeling ctx.  Reviewed H&P, no changes  AFVSS gen NAD, uncomfortable  FHTs 140's, moderate variability, + accels, category 1 toco Q 2-23min  AROM for clear fluid, w/o diff/comp  SVE 4.5/80/-1/2  D/W pt AROM, labor progress, expectations Continue IOL Expect SVD

## 2019-03-17 NOTE — H&P (Signed)
Haley Larsen is a 23 y.o. female G1P0 at 50+ scheduled for IOL, presenting laboring.  Cervical change in MAU.  Admitted for labor.  Pregnancy dated by early [place].  Nl First tri screen.     OB History    Gravida  2   Para      Term      Preterm      AB  1   Living        SAB      TAB  1   Ectopic      Multiple      Live Births  0         G1 TAB G2 present  No abn pap, no STD  Past Medical History:  Diagnosis Date  . Medical history non-contributory    Past Surgical History:  Procedure Laterality Date  . INDUCED ABORTION    . NO PAST SURGERIES     Family History: DM, CAD Social History:  reports that she has never smoked. She has never used smokeless tobacco. She reports previous alcohol use. She reports previous drug use. Drug: Marijuana.  Student, relationship x 1+ year  Meds PNV All NKDA     Maternal Diabetes: No Genetic Screening: Normal Maternal Ultrasounds/Referrals: Normal Fetal Ultrasounds or other Referrals:  None Maternal Substance Abuse:  No Significant Maternal Medications:  None Significant Maternal Lab Results:  Lab values include: Group B Strep negative Other Comments:  None  Review of Systems  Constitutional: Negative.   HENT: Negative.   Eyes: Negative.   Respiratory: Negative.   Cardiovascular: Negative.   Gastrointestinal: Negative.   Genitourinary: Negative.   Musculoskeletal: Negative.   Skin: Negative.   Neurological: Negative.   Psychiatric/Behavioral: Negative.    Maternal Medical History:  Reason for admission: Contractions.   Contractions: Frequency: regular.   Perceived severity is moderate.    Fetal activity: Perceived fetal activity is normal.    Prenatal Complications - Diabetes: none.    Dilation: 3.5 Effacement (%): 80 Station: -2 Exam by:: Earlene Plater, RN  Blood pressure 133/81, pulse 90, temperature 98.5 F (36.9 C), temperature source Oral, resp. rate 17, height 5\' 5"  (1.651 m), weight 89.4 kg, last  menstrual period 06/04/2018, SpO2 99 %. Maternal Exam:  Uterine Assessment: Contraction strength is mild.  Contraction frequency is regular.   Abdomen: Patient reports no abdominal tenderness. Fundal height is appropriate for gestation.   Fetal presentation: vertex  Introitus: Normal vulva. Normal vagina.    Physical Exam  Constitutional: She is oriented to person, place, and time. She appears well-developed and well-nourished.  HENT:  Head: Normocephalic and atraumatic.  Cardiovascular: Normal rate and regular rhythm.  Respiratory: Effort normal and breath sounds normal. No respiratory distress. She has no wheezes.  GI: Soft. Bowel sounds are normal. She exhibits no distension. There is no abdominal tenderness.  Genitourinary:    Vulva normal.   Musculoskeletal: Normal range of motion.  Neurological: She is alert and oriented to person, place, and time.  Skin: Skin is warm and dry.  Psychiatric: She has a normal mood and affect. Her behavior is normal.    Prenatal labs: ABO, Rh: --/--/A POS (03/26 1753) Antibody: NEG (03/26 1753) Rubella: Nonimmune (09/16 0000) RPR: Nonreactive (09/16 0000)  HBsAg: Negative (09/16 0000)  HIV: Non-reactive (09/16 0000)  GBS: Negative (03/04 0000)   Hgb 13.3/Plt 328/Ur Cx neg/ Chl neg/GC neg/Varicella immune/Hgb electro WNL/First tri scr WNL/glucola 118  Limited anat F/u US completes anat, female, post plac  Assessment/Plan: 22yo G1P0 at 40+ in early labor RNI - rubella PP Epidural prn Expect SVD Augment prn   Ilka Lovick Bovard-Stuckert 03/17/2019, 7:55 PM

## 2019-03-17 NOTE — MAU Note (Signed)
Pt presents to MAU with complaints of contractions that started at 9am. Denies any VB or LOF

## 2019-03-17 NOTE — Telephone Encounter (Signed)
Preadmission screen  

## 2019-03-18 ENCOUNTER — Encounter (HOSPITAL_COMMUNITY): Payer: Self-pay | Admitting: Obstetrics and Gynecology

## 2019-03-18 LAB — CBC
HEMATOCRIT: 31.5 % — AB (ref 36.0–46.0)
HEMOGLOBIN: 9.7 g/dL — AB (ref 12.0–15.0)
MCH: 23.7 pg — ABNORMAL LOW (ref 26.0–34.0)
MCHC: 30.8 g/dL (ref 30.0–36.0)
MCV: 77 fL — ABNORMAL LOW (ref 80.0–100.0)
Platelets: 314 10*3/uL (ref 150–400)
RBC: 4.09 MIL/uL (ref 3.87–5.11)
RDW: 14 % (ref 11.5–15.5)
WBC: 18.8 10*3/uL — ABNORMAL HIGH (ref 4.0–10.5)
nRBC: 0 % (ref 0.0–0.2)

## 2019-03-18 LAB — RPR: RPR Ser Ql: NONREACTIVE

## 2019-03-18 MED ORDER — ONDANSETRON HCL 4 MG/2ML IJ SOLN: 4.0000 mg | INTRAMUSCULAR | Status: DC | PRN

## 2019-03-18 MED ORDER — ONDANSETRON HCL 4 MG PO TABS: 4.0000 mg | ORAL_TABLET | ORAL | Status: DC | PRN

## 2019-03-18 MED ORDER — SIMETHICONE 80 MG PO CHEW: 80.0000 mg | CHEWABLE_TABLET | ORAL | Status: DC | PRN

## 2019-03-18 MED ORDER — DIPHENHYDRAMINE HCL 25 MG PO CAPS: 25.0000 mg | ORAL_CAPSULE | Freq: Four times a day (QID) | ORAL | Status: DC | PRN

## 2019-03-18 MED ORDER — DIBUCAINE 1 % RE OINT: 1.0000 "application " | TOPICAL_OINTMENT | RECTAL | Status: DC | PRN

## 2019-03-18 MED ORDER — ZOLPIDEM TARTRATE 5 MG PO TABS: 5.0000 mg | ORAL_TABLET | Freq: Every evening | ORAL | Status: DC | PRN

## 2019-03-18 MED ORDER — ACETAMINOPHEN 325 MG PO TABS: 650.0000 mg | ORAL_TABLET | ORAL | Status: DC | PRN

## 2019-03-18 MED ORDER — TETANUS-DIPHTH-ACELL PERTUSSIS 5-2.5-18.5 LF-MCG/0.5 IM SUSP: 0.5000 mL | Freq: Once | INTRAMUSCULAR | Status: DC

## 2019-03-18 MED ORDER — PRENATAL MULTIVITAMIN CH
1.0000 | ORAL_TABLET | Freq: Every day | ORAL | Status: DC
  Administered 2019-03-18 – 2019-03-19 (×2): 1 via ORAL
  Filled 2019-03-18 (×2): qty 1

## 2019-03-18 MED ORDER — IBUPROFEN 600 MG PO TABS
600.0000 mg | ORAL_TABLET | Freq: Four times a day (QID) | ORAL | Status: DC
  Administered 2019-03-18 – 2019-03-19 (×6): 600 mg via ORAL
  Filled 2019-03-18 (×6): qty 1

## 2019-03-18 MED ORDER — OXYCODONE HCL 5 MG PO TABS: 5.0000 mg | ORAL_TABLET | ORAL | Status: DC | PRN

## 2019-03-18 MED ORDER — LACTATED RINGERS IV SOLN: INTRAVENOUS | Status: DC

## 2019-03-18 MED ORDER — WITCH HAZEL-GLYCERIN EX PADS: 1.0000 "application " | MEDICATED_PAD | CUTANEOUS | Status: DC | PRN

## 2019-03-18 MED ORDER — SENNOSIDES-DOCUSATE SODIUM 8.6-50 MG PO TABS
2.0000 | ORAL_TABLET | ORAL | Status: DC
  Administered 2019-03-19: 2 via ORAL
  Filled 2019-03-18: qty 2

## 2019-03-18 MED ORDER — BENZOCAINE-MENTHOL 20-0.5 % EX AERO
1.0000 "application " | INHALATION_SPRAY | CUTANEOUS | Status: DC | PRN
  Administered 2019-03-18: 1 via TOPICAL
  Filled 2019-03-18: qty 56

## 2019-03-18 MED ORDER — COCONUT OIL OIL: 1.0000 "application " | TOPICAL_OIL | Status: DC | PRN

## 2019-03-18 MED ORDER — OXYCODONE HCL 5 MG PO TABS: 10.0000 mg | ORAL_TABLET | ORAL | Status: DC | PRN

## 2019-03-18 NOTE — Lactation Note (Signed)
This note was copied from a baby's chart. Lactation Consultation Note  Patient Name: Haley Larsen NATFT'D Date: 03/18/2019 Reason for consult: Initial assessment;1st time breastfeeding;Term  Baby is 13 hours old / P1 / has been to the breast for 2 attempts / and 3 times 7-13 mins.  Per mom the baby has been sleepy / last attempted at 12:30pm  LC offered to check the baby's diaper/ dry / baby more awake and LC assisted to latch on the  Right  Breast / Latch Score 8 with swallows/ only fed for 4 mins and released/ nipple well rounded.  LC noted some areola edema at the base of the nipple and instructed mom on the use shells between  Feedings except when sleeping.  Mother informed of post-discharge support and given phone number to the lactation department, including services for phone call assistance; out-patient appointments; and breastfeeding support group. List of other breastfeeding resources in the community given in the handout. Encouraged mother to call for problems or concerns related to breastfeeding.   Maternal Data Has patient been taught Hand Expression?: Yes Does the patient have breastfeeding experience prior to this delivery?: No  Feeding Feeding Type: Breast Fed  LATCH Score Latch: Repeated attempts needed to sustain latch, nipple held in mouth throughout feeding, stimulation needed to elicit sucking reflex.  Audible Swallowing: A few with stimulation  Type of Nipple: Everted at rest and after stimulation(some areola edema )  Comfort (Breast/Nipple): Soft / non-tender  Hold (Positioning): No assistance needed to correctly position infant at breast.  LATCH Score: 8  Interventions Interventions: Breast feeding basics reviewed;Assisted with latch;Skin to skin;Breast massage;Hand express;Breast compression;Adjust position;Support pillows;Position options;Shells  Lactation Tools Discussed/Used Tools: Shells Shell Type: Inverted   Consult Status Consult  Status: Follow-up Date: 03/19/19 Follow-up type: In-patient    Matilde Sprang Wallie Lagrand 03/18/2019, 1:45 PM

## 2019-03-18 NOTE — Anesthesia Postprocedure Evaluation (Signed)
Anesthesia Post Note  Patient: Haley Larsen  Procedure(s) Performed: AN AD HOC LABOR EPIDURAL     Patient location during evaluation: Mother Baby Anesthesia Type: Epidural Level of consciousness: awake Pain management: satisfactory to patient Vital Signs Assessment: post-procedure vital signs reviewed and stable Respiratory status: spontaneous breathing Cardiovascular status: stable Anesthetic complications: no    Last Vitals:  Vitals:   03/18/19 0345 03/18/19 0745  BP: 135/79 116/71  Pulse: (!) 116 89  Resp: 18 16  Temp: 36.7 C 36.8 C  SpO2: 99% 99%    Last Pain:  Vitals:   03/18/19 0745  TempSrc: Axillary  PainSc: 1    Pain Goal: Patients Stated Pain Goal: 9 (03/17/19 1825)                 Cephus Shelling

## 2019-03-18 NOTE — Progress Notes (Signed)
Patient ID: Haley Larsen, female   DOB: 03-Dec-1996, 23 y.o.   MRN: [id number] Pt doing well. Denies pain, HA, CP, SOB, fever. She is ambulating, tolerating diet and voiding with no issues. Bonding well with baby - breastfeeding and getting colostrum. Has no complaints VSS ABD - FF and 1cm below umbilicus EXT - no homans  18.8>9.7<314  A/P: PPD#0 s/p svd - stable        Routine pp care        Heating pad prn

## 2019-03-19 ENCOUNTER — Inpatient Hospital Stay (HOSPITAL_COMMUNITY): Payer: 59

## 2019-03-19 MED ORDER — IBUPROFEN 600 MG PO TABS: 600.0000 mg | ORAL_TABLET | Freq: Four times a day (QID) | ORAL | 1 refills | Status: AC | PRN

## 2019-03-19 NOTE — Discharge Instructions (Signed)
Call with any concerns [phone]

## 2019-03-19 NOTE — Lactation Note (Signed)
This note was copied from a baby's chart. Lactation Consultation Note  Patient Name: Haley Larsen WOEHO'Z Date: 03/19/2019 Reason for consult: Initial assessment;Primapara;1st time breastfeeding;Term  49 hours old FT female who is being exclusively BF by her mother, she's a P1. Baby is at 5% weight loss and per mom BF is going well; she can hear baby swallowing when at the breast. Mom and baby are going home today. Reviewed discharge instructions, engorgement prevention and treatment, treatment and prevention for sore nipples (mom requested comfort gels, she heard about it during her BF class) and red flags on when to call baby's pediatrician. Parents reported all questions and concerns were answered, they're both aware of LC OP services and will contact as needed.  Maternal Data    Feeding    Interventions Interventions: Breast feeding basics reviewed;Comfort gels  Lactation Tools Discussed/Used Tools: Comfort gels   Consult Status Consult Status: Complete Date: 03/19/19 Follow-up type: Call as needed    [name] Venetia Constable 03/19/2019, 2:00 PM

## 2019-03-19 NOTE — Discharge Summary (Signed)
OB Discharge Summary     Patient Name: Haley Larsen DOB: [birth year] MRN: [id number]  Date of admission: 03/17/2019 Delivering MD: Sherian Rein   Date of discharge: 03/19/2019  Admitting diagnosis: 40wks, ctx Intrauterine pregnancy: [redacted]w[redacted]d     Secondary diagnosis:  Principal Problem:   SVD (spontaneous vaginal delivery) Active Problems:   Normal labor  Additional problems: none     Discharge diagnosis: Term Pregnancy Delivered                                                                                                Post partum procedures:none  Augmentation: AROM and Pitocin  Complications: None  Hospital course:  Onset of Labor With Vaginal Delivery     23 y.o. yo G2P1011 at [redacted]w[redacted]d was admitted in Active Labor on 03/17/2019. Patient had an uncomplicated labor course as follows:  Membrane Rupture Time/Date: 8:52 PM ,03/17/2019   Intrapartum Procedures: Episiotomy: None [1]                                         Lacerations:  1st degree [2];Vaginal [6]  Patient had a delivery of a Viable infant. 03/18/2019  Information for the patient's newborn:  Adrionna, Fetsch [[id number]]  Delivery Method: Vaginal, Spontaneous(Filed from Delivery Summary)    Pateint had an uncomplicated postpartum course.  She is ambulating, tolerating a regular diet, passing flatus, and urinating well. Patient is discharged home in stable condition on 03/19/19.   Physical exam  Vitals:   03/18/19 0345 03/18/19 0745 03/18/19 1630 03/19/19 0555  BP: 135/79 116/71 124/75 102/78  Pulse: (!) 116 89 89 77  Resp: 18 16 16 18   Temp: 98 F (36.7 C) 98.2 F (36.8 C) 98.6 F (37 C) 98.2 F (36.8 C)  TempSrc: Oral Axillary  Oral  SpO2: 99% 99% 100% 100%  Weight:      Height:       General: alert, cooperative and no distress Lochia: appropriate Uterine Fundus: firm Incision: N/A DVT Evaluation: No evidence of DVT seen on physical exam. Labs: Lab Results  Component Value Date   WBC  18.8 (H) 03/18/2019   HGB 9.7 (L) 03/18/2019   HCT 31.5 (L) 03/18/2019   MCV 77.0 (L) 03/18/2019   PLT 314 03/18/2019   No flowsheet data found.  Discharge instruction: per After Visit Summary and "Baby and Me Booklet".  After visit meds:  Allergies as of 03/19/2019   No Known Allergies     Medication List    TAKE these medications   ibuprofen 600 MG tablet Commonly known as:  ADVIL,MOTRIN Take 1 tablet (600 mg total) by mouth every 6 (six) hours as needed for cramping.       Diet: routine diet  Activity: Advance as tolerated. Pelvic rest for 6 weeks.   Outpatient follow up:6 weeks Follow up Appt:No future appointments. Follow up Visit:No follow-ups on file.  Postpartum contraception: Not Discussed  Newborn Data: Live born female  Birth Weight: 6 lb 2.9 oz (2804  g) APGAR: 9, 9  Newborn Delivery   Birth date/time:  03/18/2019 00:19:00 Delivery type:  Vaginal, Spontaneous     Baby Feeding: Breast Disposition:home with mother   03/19/2019 Cathrine Muster, DO

## 2019-03-19 NOTE — Anesthesia Postprocedure Evaluation (Signed)
Anesthesia Post Note  Patient: Haley Larsen  Procedure(s) Performed: AN AD HOC LABOR EPIDURAL     Patient location during evaluation: Mother Baby Anesthesia Type: Epidural Level of consciousness: awake, awake and alert and oriented Pain management: pain level controlled Vital Signs Assessment: post-procedure vital signs reviewed and stable Respiratory status: spontaneous breathing and respiratory function stable Cardiovascular status: blood pressure returned to baseline Postop Assessment: no headache, no backache, epidural receding, patient able to bend at knees, no apparent nausea or vomiting, adequate PO intake and able to ambulate Anesthetic complications: no    Last Vitals:  Vitals:   03/18/19 1630 03/19/19 0555  BP: 124/75 102/78  Pulse: 89 77  Resp: 16 18  Temp: 37 C 36.8 C  SpO2: 100% 100%    Last Pain:  Vitals:   03/19/19 0555  TempSrc: Oral  PainSc: 0-No pain   Pain Goal: Patients Stated Pain Goal: 2 (03/18/19 1630)                 Cleda Clarks

## 2019-03-19 NOTE — Progress Notes (Signed)
CSW received consult due to score of 9 on Edinburgh Depression Screen. CSW will screen out consult due to score not being ten or higher and MOB did not answer yes to question ten regarding thoughts or feelings of self harm.  Abdiaziz Klahn, MSW, LCSW-A Clinical Social Worker [facility] [phone]     

## 2019-03-19 NOTE — Progress Notes (Addendum)
Patient ID: Haley Larsen, female   DOB: 25-Mar-1996, 23 y.o.   MRN: [id number] PPD#1 Pt doing well with no complaints. Pain well controlled. Lochia mild. No HA/CP/SOB/Fever. Ambulating with no issues. Voiding well. Bonding well with baby - breastfeeding. Desires discharge to home today VSS ABD - FF and 3cm below umbilicus EXT - no edema or homans  18.8>9.7<314  A/P: PPD#1 - stable         Discharge instructions reviewed - pp visit in 6 weeks

## 2019-03-20 ENCOUNTER — Other Ambulatory Visit: Payer: Self-pay

## 2019-03-20 ENCOUNTER — Encounter (HOSPITAL_COMMUNITY): Payer: Self-pay | Admitting: Advanced Practice Midwife

## 2019-03-20 ENCOUNTER — Inpatient Hospital Stay (HOSPITAL_COMMUNITY)
Admission: AD | Admit: 2019-03-20 | Discharge: 2019-03-20 | Disposition: A | Discharge status: Home / Self Care | Payer: 59 | Attending: Obstetrics and Gynecology | Admitting: Obstetrics and Gynecology

## 2019-03-20 DIAGNOSIS — R51 Headache: Secondary | ICD-10-CM | POA: Diagnosis present

## 2019-03-20 DIAGNOSIS — R519 Headache, unspecified: Secondary | ICD-10-CM

## 2019-03-20 MED ORDER — BUTALBITAL-APAP-CAFFEINE 50-325-40 MG PO TABS: 2.0000 | ORAL_TABLET | Freq: Four times a day (QID) | ORAL | 0 refills | Status: AC | PRN

## 2019-03-20 MED ORDER — BUTALBITAL-APAP-CAFFEINE 50-325-40 MG PO TABS
2.0000 | ORAL_TABLET | Freq: Four times a day (QID) | ORAL | Status: DC | PRN
  Administered 2019-03-20: 2 via ORAL
  Filled 2019-03-20: qty 2

## 2019-03-20 MED ORDER — CYCLOBENZAPRINE HCL 10 MG PO TABS: 10.0000 mg | ORAL_TABLET | Freq: Three times a day (TID) | ORAL | 0 refills | Status: AC | PRN

## 2019-03-20 MED ORDER — CYCLOBENZAPRINE HCL 10 MG PO TABS
10.0000 mg | ORAL_TABLET | Freq: Three times a day (TID) | ORAL | Status: DC | PRN
  Administered 2019-03-20: 10 mg via ORAL
  Filled 2019-03-20: qty 1

## 2019-03-20 NOTE — MAU Provider Note (Signed)
History     CSN: [id number]  Arrival date and time: 03/20/19 1400   First Provider Initiated Contact with Patient 03/20/19 1446      Chief Complaint  Patient presents with  . Headache   22 y.o. [id number] s/p SVD 2 days ago presenting with HA. HA started yesterday after she was discharged. HA is located occipital, frontal, and temporal- bilateral. HA is worse with upright position and better when she lies down. Rates pain 10/10. She took Tylenol but it didn't help. She had an epidural placed for labor.   OB History    Gravida  2   Para  1   Term  1   Preterm      AB  1   Living  1     SAB      TAB  1   Ectopic      Multiple  0   Live Births  1           Past Medical History:  Diagnosis Date  . Medical history non-contributory   . SVD (spontaneous vaginal delivery) 03/18/2019    Past Surgical History:  Procedure Laterality Date  . INDUCED ABORTION    . NO PAST SURGERIES      History reviewed. No pertinent family history.  Social History   Tobacco Use  . Smoking status: Never Smoker  . Smokeless tobacco: Never Used  Substance Use Topics  . Alcohol use: Not Currently    Comment: BEFORE PREG  . Drug use: Not Currently    Types: Marijuana    Comment: LAST SMOKED - AUG    Allergies: No Known Allergies  Medications Prior to Admission  Medication Sig Dispense Refill Last Dose  . ibuprofen (ADVIL,MOTRIN) 600 MG tablet Take 1 tablet (600 mg total) by mouth every 6 (six) hours as needed for cramping. 40 tablet 1     Review of Systems  Constitutional: Negative for fever.  Eyes: Positive for photophobia. Negative for visual disturbance.  Neurological: Positive for headaches.   Physical Exam   Blood pressure (!) 112/59, pulse 88, temperature 98.6 F (37 C), temperature source Oral, resp. rate 18, height 5\' 5"  (1.651 m), weight 87.1 kg, last menstrual period 06/04/2018, SpO2 100 %, unknown if currently breastfeeding.  Physical Exam  Nursing note  and vitals reviewed. Constitutional: She is oriented to person, place, and time. She appears well-developed and well-nourished. No distress.  HENT:  Head: Normocephalic.  Eyes: Pupils are equal, round, and reactive to light. Conjunctivae and EOM are normal. Lids are everted and swept, no foreign bodies found.  Neck: Normal range of motion.  Cardiovascular: Normal rate.  Respiratory: Effort normal. No respiratory distress.  Musculoskeletal: Normal range of motion.  Neurological: She is alert and oriented to person, place, and time. No cranial nerve deficit.  Skin: Skin is warm and dry.  Psychiatric: She has a normal mood and affect.   No results found for this or any previous visit (from the past 24 hour(s)).  MAU Course  Procedures  MDM Normal neuro exam, normal BP. Will consult anesthesia. Dr. Richardson Landry in to eval pt, states clinical exam does not meet criteria for spinal HA and blood patch not indicated at this time. Analgesics ordered.  1640: Feeling better after meds, now able to sit up and nurse infant.  Stable for discharge home  Assessment and Plan   1. Acute nonintractable headache, unspecified headache type    Discharge home Follow up at [facility] as scheduled  Notify MD for worsening sx Rx Flexeril Rx Fioricet  Allergies as of 03/20/2019   No Known Allergies     Medication List    TAKE these medications   butalbital-acetaminophen-caffeine 50-325-40 MG tablet Commonly known as:  FIORICET, ESGIC Take 2 tablets by mouth every 6 (six) hours as needed for headache.   cyclobenzaprine 10 MG tablet Commonly known as:  FLEXERIL Take 1 tablet (10 mg total) by mouth 3 (three) times daily as needed for muscle spasms.   ibuprofen 600 MG tablet Commonly known as:  ADVIL,MOTRIN Take 1 tablet (600 mg total) by mouth every 6 (six) hours as needed for cramping.      Donette Larry, CNM 03/20/2019, 4:48 PM

## 2019-03-20 NOTE — Progress Notes (Signed)
Called to assess patient in MAU. Pt epidural placement per Dr Liliane Bade note was uneventful. And had an Uneventful delivery 52 hours ago.  Patient C/O of headache before she came to hospital now pain in neck and shoulders.   She states that although she tried taking fluid and Tylenol, it was not working  So she came in.  Although the room was dark when I came in, after turning on the light patient in no apparent distress.  I asked the patient about her headache she said the pain was in her neck and shoulders,  I asked patient how much fluid she drank every hour she just said that she was a" water drinker" and that she" drank bottles and one Pepsi" ".   I asked was she able to take care of her baby and she stated yes  At this time I discussed conservative treatment options with her: we would change the PO medication that she was on to Fioricet, Encourage her to drink water ( or start an IV) and give her more beverages with Caffeinated beverages to see if she improves. We will reevaluate to see it further intervention is necessary.    Trevor Iha, M.D.

## 2019-03-20 NOTE — Discharge Instructions (Signed)
Tension Headache, Adult  A tension headache is pain, pressure, or aching in your head. Tension headaches can last from 30 minutes to several days.  Follow these instructions at home:  Managing pain   Take over-the-counter and prescription medicines only as told by your doctor.   When you have a headache, lie down in a dark, quiet room.   If told, put ice on your head and neck:  ? Put ice in a plastic bag.  ? Place a towel between your skin and the bag.  ? Leave the ice on for 20 minutes, 2-3 times a day.   If told, put heat on the back of your neck. Do this as often as your doctor tells you to. Use the kind of heat that your doctor recommends, such as a moist heat pack or a heating pad.  ? Place a towel between your skin and the heat.  ? Leave the heat on for 20-30 minutes.  ? Remove the heat if your skin turns bright red.  Eating and drinking   Eat meals on a regular schedule.   Watch how much alcohol you drink:  ? If you are a woman and are not pregnant, do not drink more than 1 drink a day.  ? If you are a man, do not drink more than 2 drinks a day.   Drink enough fluid to keep your pee (urine) pale yellow.   Do not use a lot of caffeine, or stop using caffeine.  Lifestyle   Get enough sleep. Get 7-9 hours of sleep each night. Or get the amount of sleep that your doctor tells you to.   At bedtime, remove all electronic devices from your room. Examples of electronic devices are computers, phones, and tablets.   Find ways to lessen your stress. Some things that can lessen stress are:  ? Exercise.  ? Deep breathing.  ? Yoga.  ? Music.  ? Positive thoughts.   Sit up straight. Do not tighten (tense) your muscles.   Do not use any products that have nicotine or tobacco in them, such as cigarettes and e-cigarettes. If you need help quitting, ask your doctor.  General instructions     Keep all follow-up visits as told by your doctor. This is important.   Avoid things that can bring on headaches. Keep a  journal to find out if certain things bring on headaches. For example, write down:  ? What you eat and drink.  ? How much sleep you get.  ? Any change to your diet or medicines.  Contact a doctor if:   Your headache does not get better.   Your headache comes back.   You have a headache and sounds, light, or smells bother you.   You feel sick to your stomach (nauseous) or you throw up (vomit).   Your stomach hurts.  Get help right away if:   You suddenly get a very bad headache along with any of these:  ? A stiff neck.  ? Feeling sick to your stomach.  ? Throwing up.  ? Feeling weak.  ? Trouble seeing.  ? Feeling short of breath.  ? A rash.  ? Feeling unusually sleepy.  ? Trouble speaking.  ? Pain in your eye or ear.  ? Trouble walking or balancing.  ? Feeling like you will pass out (faint).  ? Passing out.  Summary   A tension headache is pain, pressure, or aching in your head.   Tension   headaches can last from 30 minutes to several days.   Lifestyle changes and medicines may help relieve pain.  This information is not intended to replace advice given to you by your health care provider. Make sure you discuss any questions you have with your health care provider.  Document Released: 03/04/2010 Document Revised: 03/20/2017 Document Reviewed: 03/20/2017  Elsevier Interactive Patient Education  2019 [employer]

## 2019-03-20 NOTE — MAU Note (Signed)
Haley Larsen is a 23 y.o. here in MAU reporting: headache since she was discharged yesterday. States it is hard for her to hold her head up. States headache is worse when she is upright. Pt had an epidural during labor. Tried tylenol with no relief  Onset of complaint: yesterday  Pain score: 10/10  Vitals:   03/20/19 1419  BP: 136/81  Pulse: 84  Resp: 18  Temp: 98.6 F (37 C)  SpO2: 100%      Lab orders placed from triage: none
